# Patient Record
Sex: Female | Born: 1990 | Race: Black or African American | Hispanic: No | Marital: Single | Smoking: Current some day smoker
Health system: Southern US, Community
[De-identification: ages and names within clinical notes are randomized; demographics above are authoritative.]

## PROBLEM LIST (undated history)

## (undated) DIAGNOSIS — J45909 Unspecified asthma, uncomplicated: Secondary | ICD-10-CM

## (undated) DIAGNOSIS — R079 Chest pain, unspecified: Secondary | ICD-10-CM

## (undated) DIAGNOSIS — R011 Cardiac murmur, unspecified: Secondary | ICD-10-CM

## (undated) HISTORY — DX: Cardiac murmur, unspecified: R01.1

## (undated) HISTORY — DX: Chest pain, unspecified: R07.9

## (undated) HISTORY — DX: Unspecified asthma, uncomplicated: J45.909

---

## 2012-05-24 ENCOUNTER — Ambulatory Visit (INDEPENDENT_AMBULATORY_CARE_PROVIDER_SITE_OTHER): Admitting: Emergency Medicine

## 2012-05-24 VITALS — BP 106/64 | HR 61 | Temp 98.4°F | Resp 16 | Ht 64.0 in | Wt 104.0 lb

## 2012-05-24 DIAGNOSIS — Z0000 Encounter for general adult medical examination without abnormal findings: Secondary | ICD-10-CM

## 2012-05-24 NOTE — Progress Notes (Signed)
  Subjective:    Patient ID: Elaine Jones, female    DOB: November 02, 1990, 21 y.o.   MRN: [id number]  HPI  Sport physical  Review of Systems Sport physical    Objective:   Physical Exam  Sport physical      Assessment & Plan:  Fit to play

## 2012-05-29 ENCOUNTER — Ambulatory Visit (INDEPENDENT_AMBULATORY_CARE_PROVIDER_SITE_OTHER): Admitting: Family Medicine

## 2012-05-29 ENCOUNTER — Ambulatory Visit

## 2012-05-29 VITALS — BP 94/60 | HR 68 | Temp 97.9°F | Resp 12 | Ht 64.0 in | Wt 102.6 lb

## 2012-05-29 DIAGNOSIS — D649 Anemia, unspecified: Secondary | ICD-10-CM

## 2012-05-29 DIAGNOSIS — R079 Chest pain, unspecified: Secondary | ICD-10-CM

## 2012-05-29 DIAGNOSIS — R42 Dizziness and giddiness: Secondary | ICD-10-CM

## 2012-05-29 LAB — POCT CBC
Granulocyte percent: 50.4 %G (ref 37–80)
HCT, POC: 35.6 % — AB (ref 37.7–47.9)
Hemoglobin: 10.8 g/dL — AB (ref 12.2–16.2)
Lymph, poc: 3.3 (ref 0.6–3.4)
MCH, POC: 26.9 pg — AB (ref 27–31.2)
MCHC: 30.3 g/dL — AB (ref 31.8–35.4)
MCV: 88.5 fL (ref 80–97)
MID (cbc): 0.4 (ref 0–0.9)
MPV: 8.6 fL (ref 0–99.8)
POC Granulocyte: 3.8 (ref 2–6.9)
POC LYMPH PERCENT: 44.3 % (ref 10–50)
POC MID %: 5.3 %M (ref 0–12)
Platelet Count, POC: 238 10*3/uL (ref 142–424)
RBC: 4.02 M/uL — AB (ref 4.04–5.48)
RDW, POC: 13.5 %
WBC: 7.5 10*3/uL (ref 4.6–10.2)

## 2012-05-29 LAB — GLUCOSE, POCT (MANUAL RESULT ENTRY): POC Glucose: 90 mg/dl (ref 70–99)

## 2012-05-29 NOTE — Progress Notes (Signed)
[facility]:  Office Visit  Chief Complaint:  Chief Complaint  Patient presents with  . Chest Pain    2 weeks  tightness happens when she is playing basketball    HPI: Elaine Jones is a 21 y.o. female who complains of  " chest pressure, chest tightness can't describe it beyond that" intermittent x 2 weeks. No known injury. With exertion and with activities. Related to eating and activities.Burgess Estelle was first day playing BB in practice. Denies HA, vision changes, diaphoresis, cough, URI sxs, GERD; She has a history of CP like this before.  Has a PMH of heart murmur, saw cardiologist in [place] in the last 2 years and was dx as benign and was given return to play after getting echocardiogram, EKG, CXR were all normal.  Denies diabetes, XOL Sophomore in college   Past Medical History  Diagnosis Date  . Heart murmur   . Asthma    History reviewed. No pertinent past surgical history. History   Social History  . Marital Status: Single    Spouse Name: N/A    Number of Children: N/A  . Years of Education: N/A   Social History Main Topics  . Smoking status: Never Smoker   . Smokeless tobacco: None  . Alcohol Use: No  . Drug Use: No  . Sexually Active: None   Other Topics Concern  . None   Social History Narrative  . None   Family History  Problem Relation Age of Onset  . Hypertension Mother    Allergies  Allergen Reactions  . Amoxicillin    Prior to Admission medications   Medication Sig Start Date End Date Taking? Authorizing Provider  albuterol (PROVENTIL HFA;VENTOLIN HFA) 108 (90 BASE) MCG/ACT inhaler Inhale 2 puffs into the lungs every 6 (six) hours as needed.   Yes Historical Provider, MD     ROS: The patient denies fevers, chills, night sweats, unintentional weight loss, chest pain, palpitations, wheezing, dyspnea on exertion, nausea, vomiting, abdominal pain, dysuria, hematuria, melena, numbness, weakness, or  tingling.  All other systems have been reviewed and were otherwise negative with the exception of those mentioned in the HPI and as above.    PHYSICAL EXAM: Filed Vitals:   05/29/12 1816  BP: 94/60  Pulse: 68  Temp: 97.9 F (36.6 C)  Resp: 12   Filed Vitals:   05/29/12 1816  Height: 5\' 4"  (1.626 m)  Weight: 102 lb 9.6 oz (46.539 kg)   Body mass index is 17.61 kg/(m^2).  General: Alert, no acute distress HEENT:  Normocephalic, atraumatic, oropharynx patent.  Cardiovascular:  Regular rate and rhythm, no rubs , + LLSB systolic murmur, or gallops.  No Carotid bruits, radial pulse intact. No pedal edema.  Respiratory: Clear to auscultation bilaterally.  No wheezes, rales, or rhonchi.  No cyanosis, no use of accessory musculature GI: No organomegaly, abdomen is soft and non-tender, positive bowel sounds.  No masses. Skin: No rashes. Neurologic: Facial musculature symmetric. Psychiatric: Patient is appropriate throughout our interaction. Lymphatic: No cervical lymphadenopathy Musculoskeletal: Gait intact.   LABS: Results for orders placed in visit on 05/29/12  POCT CBC      Component Value Range   WBC 7.5  4.6 - 10.2 K/uL   Lymph, poc 3.3  0.6 - 3.4   POC LYMPH PERCENT 44.3  10 - 50 %L   MID (cbc) 0.4  0 - 0.9   POC MID % 5.3  0 - 12 %M  POC Granulocyte 3.8  2 - 6.9   Granulocyte percent 50.4  37 - 80 %G   RBC 4.02 (*) 4.04 - 5.48 M/uL   Hemoglobin 10.8 (*) 12.2 - 16.2 g/dL   HCT, POC 16.1 (*) 09.6 - 47.9 %   MCV 88.5  80 - 97 fL   MCH, POC 26.9 (*) 27 - 31.2 pg   MCHC 30.3 (*) 31.8 - 35.4 g/dL   RDW, POC 04.5     Platelet Count, POC 238  142 - 424 K/uL   MPV 8.6  0 - 99.8 fL  GLUCOSE, POCT (MANUAL RESULT ENTRY)      Component Value Range   POC Glucose 90  70 - 99 mg/dl     EKG/XRAY:   Primary read interpreted by Dr. Conley Rolls at [facility]. Neg for cardiopulm process   ASSESSMENT/PLAN: Encounter Diagnoses  Name Primary?  . Chest pain Yes  . Dizziness   . Anemia     Chest pain-Msk related CP vs cardiac vs less likely pulmonary in origin. Patient has been working out more with BB practice. However she does have a h/o of heart murmur and has seen a cardiologist in past for this, echo and ekg per patient was normal inlast 2 years. Will refer to Dr. Jacinto Halim to establish care since patient is from [place].  Anemia-Iron studies pending. ? Due to heavy menses. Possible contributor to dizziness and CP.  Go to ER prn for worsenign sxs   LE, THAO PHUONG, DO 05/29/2012 7:30 PM

## 2012-05-30 ENCOUNTER — Telehealth: Payer: Self-pay

## 2012-05-30 LAB — COMPREHENSIVE METABOLIC PANEL
ALT: 25 U/L (ref 0–35)
Albumin: 4.8 g/dL (ref 3.5–5.2)
CO2: 24 mEq/L (ref 19–32)
Chloride: 105 mEq/L (ref 96–112)
Glucose, Bld: 67 mg/dL — ABNORMAL LOW (ref 70–99)
Potassium: 3.5 mEq/L (ref 3.5–5.3)
Sodium: 140 mEq/L (ref 135–145)
Total Bilirubin: 0.8 mg/dL (ref 0.3–1.2)
Total Protein: 7.8 g/dL (ref 6.0–8.3)

## 2012-05-30 LAB — IRON AND TIBC
%SAT: 24 % (ref 20–55)
Iron: 79 ug/dL (ref 42–145)
TIBC: 323 ug/dL (ref 250–470)
UIBC: 244 ug/dL (ref 125–400)

## 2012-05-30 LAB — COMPREHENSIVE METABOLIC PANEL WITH GFR
AST: 74 U/L — ABNORMAL HIGH (ref 0–37)
Alkaline Phosphatase: 42 U/L (ref 39–117)
BUN: 20 mg/dL (ref 6–23)
Calcium: 9.9 mg/dL (ref 8.4–10.5)
Creat: 0.9 mg/dL (ref 0.50–1.10)

## 2012-05-30 LAB — FERRITIN: Ferritin: 55 ng/mL (ref 10–291)

## 2012-05-30 LAB — TSH: TSH: 2.313 u[IU]/mL (ref 0.350–4.500)

## 2012-05-30 NOTE — Telephone Encounter (Signed)
I have advised patient the note is at front desk for her not to participate in sports until she has seen cardiology.

## 2012-05-30 NOTE — Telephone Encounter (Signed)
PT WOULD LIKE DR. LE TO CALL HER REGARDING NOT PRACTICING BASKETBALL UNTIL AFTER HER APPOINTMENT WITH CARDIOLOGIST.   CBN [phone]

## 2012-05-31 ENCOUNTER — Encounter: Payer: Self-pay | Admitting: Radiology

## 2012-06-12 ENCOUNTER — Ambulatory Visit: Payer: Self-pay | Admitting: Cardiovascular Disease

## 2012-06-12 ENCOUNTER — Encounter: Payer: Self-pay | Admitting: Family Medicine

## 2012-06-18 ENCOUNTER — Encounter: Payer: Self-pay | Admitting: *Deleted

## 2012-06-18 ENCOUNTER — Encounter: Payer: Self-pay | Admitting: Cardiology

## 2012-06-18 DIAGNOSIS — R011 Cardiac murmur, unspecified: Secondary | ICD-10-CM | POA: Insufficient documentation

## 2012-06-18 DIAGNOSIS — J45909 Unspecified asthma, uncomplicated: Secondary | ICD-10-CM | POA: Insufficient documentation

## 2012-06-18 DIAGNOSIS — R079 Chest pain, unspecified: Secondary | ICD-10-CM | POA: Insufficient documentation

## 2012-06-19 ENCOUNTER — Ambulatory Visit: Payer: Self-pay | Admitting: Cardiovascular Disease

## 2012-09-03 ENCOUNTER — Telehealth: Payer: Self-pay

## 2012-09-03 NOTE — Telephone Encounter (Signed)
PT SAID SHE WAS SEEN A FEW MONTHS AGO AND WAS REFERRED TO CARDIOLOGIST.  SHE SAID SHE WENT AND DID EVERYTHING SHE WAS TOLD.  SHE NOW NEEDS A NOTE SAYING SHE IS CLEARED TO PLAY SPORTS AGAIN.  I ADVISED HER THIS MAY REQUIRE A FOLLOW UP VISIT.  [phone]

## 2012-09-03 NOTE — Telephone Encounter (Signed)
Can you clear? Or does she need to return?

## 2012-09-04 ENCOUNTER — Telehealth: Payer: Self-pay | Admitting: Radiology

## 2012-09-04 ENCOUNTER — Encounter: Payer: Self-pay | Admitting: Radiology

## 2012-09-04 ENCOUNTER — Encounter: Payer: Self-pay | Admitting: Family Medicine

## 2012-09-04 DIAGNOSIS — R011 Cardiac murmur, unspecified: Secondary | ICD-10-CM

## 2012-09-04 NOTE — Telephone Encounter (Signed)
Letter c

## 2012-09-04 NOTE — Telephone Encounter (Addendum)
She has seen Dr Jacinto Halim. I called his office. They are faxing the report. Report/ received. Have given to Dr Conley Rolls

## 2012-09-04 NOTE — Assessment & Plan Note (Signed)
Patient has seen Dr Jacinto Halim on 06/15/12 for evaluation had stress testing/ treadmill this was negative for exercise induced ischemia/ normal study

## 2012-09-04 NOTE — Telephone Encounter (Signed)
Letter clearing her is placed at front desk. Patient advised. Dr Conley Rolls also wanted me to advise patient to take daily iron suppliment for 2 months then return to clinic for repeat labs./ office visit. Patient advised.

## 2012-09-04 NOTE — Telephone Encounter (Signed)
Message copied by Caffie Damme on Tue Sep 04, 2012 10:17 AM ------      Message from: LE, [place] P      Created: Mon Sep 03, 2012 10:21 AM      Regarding: Return to play       Emmette Katt-            Can you get the report from the cardiologist she saw? Was is Dr. Myrtis Ser. If I see his note and he does not think it was anything then I can write her a note to return to play but I need to see the note and/or any workup that they did.            Thanks,      T. Conley Rolls

## 2012-09-10 ENCOUNTER — Telehealth: Payer: Self-pay | Admitting: Radiology

## 2012-09-10 NOTE — Telephone Encounter (Signed)
Patient wanted date changed on the note for sports/ this is done and given to patient.

## 2012-11-15 ENCOUNTER — Ambulatory Visit (INDEPENDENT_AMBULATORY_CARE_PROVIDER_SITE_OTHER): Admitting: Physician Assistant

## 2012-11-15 VITALS — BP 136/99 | HR 105 | Temp 98.0°F | Resp 16 | Ht 63.0 in | Wt 107.0 lb

## 2012-11-15 DIAGNOSIS — M79645 Pain in left finger(s): Secondary | ICD-10-CM

## 2012-11-15 DIAGNOSIS — S61209A Unspecified open wound of unspecified finger without damage to nail, initial encounter: Secondary | ICD-10-CM

## 2012-11-15 DIAGNOSIS — S61218A Laceration without foreign body of other finger without damage to nail, initial encounter: Secondary | ICD-10-CM

## 2012-11-15 DIAGNOSIS — M79609 Pain in unspecified limb: Secondary | ICD-10-CM

## 2012-11-15 DIAGNOSIS — M79644 Pain in right finger(s): Secondary | ICD-10-CM

## 2012-11-15 NOTE — Progress Notes (Signed)
   [address] [place] [address]   Phone [phone]  Subjective:    Patient ID: Elaine Jones, female    DOB: 03/27/1991, 22 y.o.   MRN: [id number]  HPI  Pt presents to clinic with lacerations to both her 2nd digits.  She was taking a calender off the wall and when she removed it it has nails that sliced her fingers.  She believes she is updated with her tetanus because she had to get shots before she started to play basketball.  She has pain at both the wound sites.  Review of Systems  Skin: Positive for wound.       Objective:   Physical Exam  Vitals reviewed. Constitutional: She is oriented to person, place, and time. She appears well-developed and well-nourished.  Pulmonary/Chest: Effort normal.  Neurological: She is alert and oriented to person, place, and time.  Skin: Skin is warm and dry.       1.5 cm laceration on 2nd digit of L hand on lateral edge of distal aspect of finger in a V shaped. 2 cm laceration on 2nd digit of R hand on the lateral edge of distal aspect, no pad of finger a skin avulsion ~1cm long 0.5 cm very superficial laceration on 3rd distal aspect of 3rd R digit  Psychiatric: She has a normal mood and affect. Her behavior is normal. Judgment and thought content normal.   Procedure:  MC block bilaterally with 2% lidocaine.  L 2nd digit closed with 5-0 Ethilin #3 SI sutures.  Wound difficult to close to to the size of the thin skin flap.  R 2nd digit closed with 5-0 Ethilon #2 SI and #2 horizontal sutures.  Skin avulsion covered with xeroform gauze.  Drsg placed on both wounds.     Assessment & Plan:

## 2012-11-15 NOTE — Patient Instructions (Addendum)

## 2012-11-16 ENCOUNTER — Encounter: Payer: Self-pay | Admitting: Physician Assistant

## 2012-11-26 ENCOUNTER — Ambulatory Visit (INDEPENDENT_AMBULATORY_CARE_PROVIDER_SITE_OTHER): Admitting: Physician Assistant

## 2012-11-26 VITALS — BP 120/64 | HR 66 | Temp 98.5°F | Resp 16 | Ht 64.0 in | Wt 110.0 lb

## 2012-11-26 DIAGNOSIS — S61219A Laceration without foreign body of unspecified finger without damage to nail, initial encounter: Secondary | ICD-10-CM

## 2012-11-26 DIAGNOSIS — S61209A Unspecified open wound of unspecified finger without damage to nail, initial encounter: Secondary | ICD-10-CM

## 2012-11-26 NOTE — Progress Notes (Signed)
   [address] [place] [address]   Phone [phone]  Subjective:    Patient ID: Elaine Jones, female    DOB: 1990-10-28, 22 y.o.   MRN: [id number]  HPI  Pt presents to clinic for suture removal. She is not having any problems with the fingers other than they are a little sore and the sensation is a little different.  Review of Systems     Objective:   Physical Exam  Vitals reviewed. Constitutional: She is oriented to person, place, and time. She appears well-developed and well-nourished.  HENT:  Head: Normocephalic and atraumatic.  Right Ear: External ear normal.  Left Ear: External ear normal.  Pulmonary/Chest: Effort normal.  Neurological: She is alert and oriented to person, place, and time.  Skin: Skin is warm and dry.  Well healed wounds.  Sutures removed with difficulty.  Psychiatric: She has a normal mood and affect. Her behavior is normal. Judgment and thought content normal.       Assessment & Plan:  Laceration on fingers - healed and wound care d/w pt.

## 2013-08-24 ENCOUNTER — Ambulatory Visit: Payer: BC Managed Care – PPO | Admitting: Family Medicine

## 2013-08-24 VITALS — BP 104/78 | HR 72 | Temp 98.1°F | Resp 16 | Ht 64.0 in | Wt 100.0 lb

## 2013-08-24 DIAGNOSIS — J029 Acute pharyngitis, unspecified: Secondary | ICD-10-CM

## 2013-08-24 DIAGNOSIS — B9789 Other viral agents as the cause of diseases classified elsewhere: Secondary | ICD-10-CM

## 2013-08-24 DIAGNOSIS — R059 Cough, unspecified: Secondary | ICD-10-CM

## 2013-08-24 DIAGNOSIS — B349 Viral infection, unspecified: Secondary | ICD-10-CM

## 2013-08-24 DIAGNOSIS — R05 Cough: Secondary | ICD-10-CM

## 2013-08-24 MED ORDER — BENZONATATE 100 MG PO CAPS: 100.0000 mg | ORAL_CAPSULE | Freq: Three times a day (TID) | ORAL | Status: DC | PRN

## 2013-08-24 MED ORDER — HYDROCODONE-HOMATROPINE 5-1.5 MG/5ML PO SYRP: 5.0000 mL | ORAL_SOLUTION | ORAL | Status: DC | PRN

## 2013-08-24 NOTE — Patient Instructions (Signed)
Drink lots of fluids  Get lots of rest  Use cough syrup at night or weekends  Take cough pills if going to be needing to be awake like on school days

## 2013-08-24 NOTE — Progress Notes (Signed)
Subjective: 22 year old Elaine Jones. [employer] student who is here with a sore throat and cough. She started getting sick with it either Thursday. She had a bad sore throat Thursday and could not go to class. She's developed a cough for can bring things up. No wheezing. She does not smoke. Ears are not bothering her. Nasal congestion but not blowing out a whole lot. Otherwise healthy.  Objective: Green hair.TMs normal. Throat erythematous without exudate. Neck supple without significant nodes. Chest is clear. Heart regular. Has a tight cough.  Assessment: Sore throat Cough Viral URI  Plan: Strep test  Results for orders placed in visit on 08/24/13  POCT RAPID STREP A (OFFICE)      Result Value Range   Rapid Strep A Screen Negative  Negative   Viral syndrome, cough, sore throat.  Symptomatic treatment

## 2013-08-27 ENCOUNTER — Encounter: Payer: Self-pay | Admitting: *Deleted

## 2014-10-28 ENCOUNTER — Encounter (HOSPITAL_COMMUNITY): Payer: Self-pay | Admitting: Emergency Medicine

## 2014-10-28 ENCOUNTER — Emergency Department (HOSPITAL_COMMUNITY)
Admission: EM | Admit: 2014-10-28 | Discharge: 2014-10-29 | Disposition: A | Discharge status: Home / Self Care | Payer: Medicaid - Out of State | Attending: Emergency Medicine | Admitting: Emergency Medicine

## 2014-10-28 DIAGNOSIS — L509 Urticaria, unspecified: Secondary | ICD-10-CM | POA: Diagnosis not present

## 2014-10-28 DIAGNOSIS — R011 Cardiac murmur, unspecified: Secondary | ICD-10-CM | POA: Insufficient documentation

## 2014-10-28 DIAGNOSIS — R51 Headache: Secondary | ICD-10-CM | POA: Diagnosis present

## 2014-10-28 DIAGNOSIS — J45909 Unspecified asthma, uncomplicated: Secondary | ICD-10-CM | POA: Insufficient documentation

## 2014-10-28 DIAGNOSIS — Z880 Allergy status to penicillin: Secondary | ICD-10-CM | POA: Diagnosis not present

## 2014-10-28 DIAGNOSIS — Z79899 Other long term (current) drug therapy: Secondary | ICD-10-CM | POA: Insufficient documentation

## 2014-10-28 NOTE — ED Notes (Signed)
Pt added that she has had diarrhea since Thursday and has nausea she would rate a 3 of 10 on a scale of 1 to 10

## 2014-10-28 NOTE — ED Notes (Signed)
Pt states she is breaking out in hives all over  Pt states it started about 2 hrs ago  Pt states she took 2 allergy pills about 30 to 45 minutes ago  Pt denies changing anything as far as laundry detergent, soap, etc

## 2014-10-29 MED ORDER — DIPHENHYDRAMINE HCL 25 MG PO TABS: 25.0000 mg | ORAL_TABLET | Freq: Four times a day (QID) | ORAL | Status: DC | PRN

## 2014-10-29 MED ORDER — DIPHENHYDRAMINE HCL 25 MG PO CAPS
25.0000 mg | ORAL_CAPSULE | Freq: Once | ORAL | Status: AC
  Administered 2014-10-29: 25 mg via ORAL
  Filled 2014-10-29: qty 1

## 2014-10-29 MED ORDER — FAMOTIDINE 20 MG PO TABS: 20.0000 mg | ORAL_TABLET | Freq: Two times a day (BID) | ORAL | Status: DC

## 2014-10-29 MED ORDER — PREDNISONE 20 MG PO TABS
60.0000 mg | ORAL_TABLET | Freq: Once | ORAL | Status: AC
  Administered 2014-10-29: 60 mg via ORAL
  Filled 2014-10-29: qty 3

## 2014-10-29 MED ORDER — FAMOTIDINE 20 MG PO TABS
40.0000 mg | ORAL_TABLET | Freq: Once | ORAL | Status: AC
  Administered 2014-10-29: 40 mg via ORAL
  Filled 2014-10-29: qty 2

## 2014-10-29 NOTE — ED Provider Notes (Signed)
CSN: [id number]     Arrival date & time 10/28/14  2336 History   First MD Initiated Contact with Patient 10/28/14 2343     Chief Complaint  Patient presents with  . Rash    (Consider location/radiation/quality/duration/timing/severity/associated sxs/prior Treatment) Patient is a 24 y.o. female presenting with rash. The history is provided by the patient. No language interpreter was used.  Rash Location:  Full body Quality: itchiness, redness and swelling   Quality: not painful, not peeling, not scaling and not weeping   Severity:  Moderate Onset quality:  Sudden Duration:  3 hours Timing:  Constant Progression:  Improving Chronicity:  New Context: not eggs, not exposure to similar rash, not food, not medications, not new detergent/soap, not nuts, not plant contact and not sick contacts   Relieved by:  Antihistamines Exacerbated by: itching. Associated symptoms: no diarrhea (no diarrhea associated with onset of rash), no fatigue, no fever, no hoarse voice, no shortness of breath, no throat swelling, no tongue swelling and not wheezing     Past Medical History  Diagnosis Date  . Heart murmur   . Asthma   . Chest pain    History reviewed. No pertinent past surgical history. Family History  Problem Relation Age of Onset  . Hypertension Mother    History  Substance Use Topics  . Smoking status: Never Smoker   . Smokeless tobacco: Not on file  . Alcohol Use: No   OB History    No data available      Review of Systems  Constitutional: Negative for fever and fatigue.  HENT: Negative for hoarse voice.   Respiratory: Negative for shortness of breath and wheezing.   Gastrointestinal: Negative for diarrhea (no diarrhea associated with onset of rash).  Skin: Positive for rash.  All other systems reviewed and are negative.   Allergies  Amoxicillin  Home Medications   Prior to Admission medications   Medication Sig Start Date End Date Taking? Authorizing Provider   ibuprofen (ADVIL,MOTRIN) 200 MG tablet Take 400 mg by mouth every 6 (six) hours as needed for moderate pain.   Yes Historical Provider, MD  albuterol (PROVENTIL HFA;VENTOLIN HFA) 108 (90 BASE) MCG/ACT inhaler Inhale 2 puffs into the lungs every 6 (six) hours as needed.    Historical Provider, MD  benzonatate (TESSALON) 100 MG capsule Take 1-2 capsules (100-200 mg total) by mouth 3 (three) times daily as needed for cough. Patient not taking: Reported on 10/29/2014 08/24/13   Peyton Najjaravid H Hopper, MD  diphenhydrAMINE (BENADRYL) 25 MG tablet Take 1 tablet (25 mg total) by mouth every 6 (six) hours as needed for itching (Rash). 10/29/14   Antony MaduraKelly Ahlijah Raia, PA-C  famotidine (PEPCID) 20 MG tablet Take 1 tablet (20 mg total) by mouth 2 (two) times daily. 10/29/14   Antony MaduraKelly Theotis Gerdeman, PA-C  HYDROcodone-homatropine (HYCODAN) 5-1.5 MG/5ML syrup Take 5 mLs by mouth every 4 (four) hours as needed for cough. Patient not taking: Reported on 10/29/2014 08/24/13   Peyton Najjaravid H Hopper, MD   BP 139/92 mmHg  Pulse 93  Temp(Src) 97.5 F (36.4 C) (Oral)  Resp 16  SpO2 100%  LMP 10/28/2014 (Exact Date)   Physical Exam  Constitutional: She is oriented to person, place, and time. She appears well-developed and well-nourished. No distress.  HENT:  Head: Normocephalic and atraumatic.  Mouth/Throat: Oropharynx is clear and moist. No oropharyngeal exudate.  Oropharynx clear. No angioedema. Patient tolerating secretions without difficulty.  Eyes: Conjunctivae and EOM are normal. No scleral icterus.  Neck: Normal  range of motion.  No stridor  Pulmonary/Chest: Effort normal and breath sounds normal. No respiratory distress. She has no wheezes.  Respirations even and unlabored. Lungs clear.  Musculoskeletal: Normal range of motion.  Neurological: She is alert and oriented to person, place, and time. She exhibits normal muscle tone. Coordination normal.  Skin: Skin is warm and dry. Rash noted. She is not diaphoretic. No erythema. No pallor.   Pruritic, raised, macular, erythematous rash consistent with urticaria noted to chest, back, and bilateral extremities. Patient states that this is improved compared to prior after treatment with generic antihistamines.  Psychiatric: She has a normal mood and affect. Her behavior is normal.  Nursing note and vitals reviewed.   ED Course  Procedures (including critical care time) Labs Review Labs Reviewed - No data to display  Imaging Review No results found.   EKG Interpretation None      Medications  diphenhydrAMINE (BENADRYL) capsule 25 mg (25 mg Oral Given 10/29/14 0039)  famotidine (PEPCID) tablet 40 mg (40 mg Oral Given 10/29/14 0039)  predniSONE (DELTASONE) tablet 60 mg (60 mg Oral Given 10/29/14 0040)    MDM   Final diagnoses:  Urticaria    Patient re-evaluated prior to discharge, is hemodynamically stable, in no respiratory distress, and denies the feeling of throat closing. Pt has been advised to take OTC benadryl and return to the ED if they have a mod-severe allergic rxn (s/s including throat closing, difficulty breathing, swelling of lips face or tongue). Pt is to follow up with their PCP. Pt is agreeable with plan and verbalizes understanding with plan. Patient discharged in good condition.   Filed Vitals:   10/28/14 2342  BP: 139/92  Pulse: 93  Temp: 97.5 F (36.4 C)  TempSrc: Oral  Resp: 16  SpO2: 100%     Antony Madura, PA-C 10/29/14 0245  April K Palumbo-Rasch, MD 10/29/14 [phone]

## 2014-10-29 NOTE — Discharge Instructions (Signed)
Hives Hives are itchy, red, swollen areas of the skin. They can vary in size and location on your body. Hives can come and go for hours or several days (acute hives) or for several weeks (chronic hives). Hives do not spread from person to person (noncontagious). They may get worse with scratching, exercise, and emotional stress. CAUSES   Allergic reaction to food, additives, or drugs.  Infections, including the common cold.  Illness, such as vasculitis, lupus, or thyroid disease.  Exposure to sunlight, heat, or cold.  Exercise.  Stress.  Contact with chemicals. SYMPTOMS   Red or white swollen patches on the skin. The patches may change size, shape, and location quickly and repeatedly.  Itching.  Swelling of the hands, feet, and face. This may occur if hives develop deeper in the skin. DIAGNOSIS  Your caregiver can usually tell what is wrong by performing a physical exam. Skin or blood tests may also be done to determine the cause of your hives. In some cases, the cause cannot be determined. TREATMENT  Mild cases usually get better with medicines such as antihistamines. Severe cases may require an emergency epinephrine injection. If the cause of your hives is known, treatment includes avoiding that trigger.  HOME CARE INSTRUCTIONS   Avoid causes that trigger your hives.  Take antihistamines as directed by your caregiver to reduce the severity of your hives. Non-sedating or low-sedating antihistamines are usually recommended. Do not drive while taking an antihistamine.  Take any other medicines prescribed for itching as directed by your caregiver.  Wear loose-fitting clothing.  Keep all follow-up appointments as directed by your caregiver. SEEK MEDICAL CARE IF:   You have persistent or severe itching that is not relieved with medicine.  You have painful or swollen joints. SEEK IMMEDIATE MEDICAL CARE IF:   You have a fever.  Your tongue or lips are swollen.  You have  trouble breathing or swallowing.  You feel tightness in the throat or chest.  You have abdominal pain. These problems may be the first sign of a life-threatening allergic reaction. Call your local emergency services (911 in [place] MAKE SURE YOU:   Understand these instructions.  Will watch your condition.  Will get help right away if you are not doing well or get worse. Document Released: 09/26/2005 Document Revised: 10/01/2013 Document Reviewed: 12/20/2011 Exit[employer] Patient Information 2015 [employer]. This information is not intended to replace advice given to you by your health care provider. Make sure you discuss any questions you have with your health care provider.  

## 2016-01-18 ENCOUNTER — Emergency Department (HOSPITAL_COMMUNITY)
Admission: EM | Admit: 2016-01-18 | Discharge: 2016-01-18 | Disposition: A | Discharge status: Home / Self Care | Payer: Medicaid - Out of State | Attending: Emergency Medicine | Admitting: Emergency Medicine

## 2016-01-18 ENCOUNTER — Encounter (HOSPITAL_COMMUNITY): Payer: Self-pay | Admitting: Oncology

## 2016-01-18 DIAGNOSIS — J069 Acute upper respiratory infection, unspecified: Secondary | ICD-10-CM

## 2016-01-18 DIAGNOSIS — J45901 Unspecified asthma with (acute) exacerbation: Secondary | ICD-10-CM | POA: Insufficient documentation

## 2016-01-18 DIAGNOSIS — R109 Unspecified abdominal pain: Secondary | ICD-10-CM | POA: Insufficient documentation

## 2016-01-18 DIAGNOSIS — Z880 Allergy status to penicillin: Secondary | ICD-10-CM | POA: Insufficient documentation

## 2016-01-18 DIAGNOSIS — R011 Cardiac murmur, unspecified: Secondary | ICD-10-CM | POA: Insufficient documentation

## 2016-01-18 DIAGNOSIS — Z7951 Long term (current) use of inhaled steroids: Secondary | ICD-10-CM | POA: Insufficient documentation

## 2016-01-18 DIAGNOSIS — Z79899 Other long term (current) drug therapy: Secondary | ICD-10-CM | POA: Insufficient documentation

## 2016-01-18 MED ORDER — FLUTICASONE PROPIONATE 50 MCG/ACT NA SUSP: 2.0000 | Freq: Every day | NASAL | Status: DC

## 2016-01-18 MED ORDER — ALBUTEROL SULFATE HFA 108 (90 BASE) MCG/ACT IN AERS: 2.0000 | INHALATION_SPRAY | RESPIRATORY_TRACT | Status: AC | PRN

## 2016-01-18 MED ORDER — ALBUTEROL SULFATE HFA 108 (90 BASE) MCG/ACT IN AERS
2.0000 | INHALATION_SPRAY | RESPIRATORY_TRACT | Status: DC | PRN
  Administered 2016-01-18: 2 via RESPIRATORY_TRACT
  Filled 2016-01-18: qty 6.7

## 2016-01-18 MED ORDER — CETIRIZINE HCL 10 MG PO CHEW: 10.0000 mg | CHEWABLE_TABLET | Freq: Every day | ORAL | Status: DC

## 2016-01-18 NOTE — ED Notes (Signed)
Pt reports cough, sore throat, congestion since Thursday.  Pt states she mainly presents to the ED for a work note.

## 2016-01-18 NOTE — ED Provider Notes (Signed)
CSN: [id number]     Arrival date & time 01/18/16  0532 History   First MD Initiated Contact with Patient 01/18/16 0545     Chief Complaint  Patient presents with  . Flu like sx      (Consider location/radiation/quality/duration/timing/severity/associated sxs/prior Treatment) HPI  This is a 25 year old female with a history of asthma who presents with multiple complaints. Patient reports nonproductive cough, sore throat, and congestion since Thursday. She denies any fevers but does endorse chills. 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 Denies any abdominal pain, vomiting, diarrhea. Reports sneezing and watery eyes as well. Patient reports she is taking over-the-counter Mucinex and TheraFlu with minimal relief.  Past Medical History  Diagnosis Date  . Heart murmur   . Asthma   . Chest pain    History reviewed. No pertinent past surgical history. Family History  Problem Relation Age of Onset  . Hypertension Mother    Social History  Substance Use Topics  . Smoking status: Never Smoker   . Smokeless tobacco: None  . Alcohol Use: No   OB History    No data available     Review of Systems  Constitutional: Positive for chills. Negative for fever.  HENT: Positive for sore throat. Negative for trouble swallowing.   Respiratory: Positive for cough and wheezing. Negative for shortness of breath.   Cardiovascular: Negative for chest pain.  Gastrointestinal: Positive for abdominal pain. Negative for vomiting and diarrhea.  Musculoskeletal: Negative for back pain.  Skin: Negative for rash.  All other systems reviewed and are negative.     Allergies  Amoxicillin  Home Medications   Prior to Admission medications   Medication Sig Start Date End Date Taking? Authorizing Provider  albuterol (PROVENTIL HFA;VENTOLIN HFA) 108 (90 Base) MCG/ACT inhaler  Inhale 2 puffs into the lungs every 4 (four) hours as needed for wheezing or shortness of breath. 01/18/16   Shon Baton, MD  benzonatate (TESSALON) 100 MG capsule Take 1-2 capsules (100-200 mg total) by mouth 3 (three) times daily as needed for cough. Patient not taking: Reported on 10/29/2014 08/24/13   Peyton Najjar, MD  cetirizine (ZYRTEC) 10 MG chewable tablet Chew 1 tablet (10 mg total) by mouth daily. 01/18/16   Shon Baton, MD  diphenhydrAMINE (BENADRYL) 25 MG tablet Take 1 tablet (25 mg total) by mouth every 6 (six) hours as needed for itching (Rash). 10/29/14   Antony Madura, PA-C  famotidine (PEPCID) 20 MG tablet Take 1 tablet (20 mg total) by mouth 2 (two) times daily. 10/29/14   Antony Madura, PA-C  fluticasone (FLONASE) 50 MCG/ACT nasal spray Place 2 sprays into both nostrils daily. 01/18/16   Shon Baton, MD  HYDROcodone-homatropine (HYCODAN) 5-1.5 MG/5ML syrup Take 5 mLs by mouth every 4 (four) hours as needed for cough. Patient not taking: Reported on 10/29/2014 08/24/13   Peyton Najjar, MD  ibuprofen (ADVIL,MOTRIN) 200 MG tablet Take 400 mg by mouth every 6 (six) hours as needed for moderate pain.    Historical Provider, MD   BP 116/82 mmHg  Pulse 60  Temp(Src) 98.3 F (36.8 C) (Oral)  Resp 12  Ht  (1.626 m)  Wt 108 lb (48.988 kg)  BMI 18.53 kg/m2  SpO2 100%  LMP 01/13/2016 (Exact Date) Physical Exam  Constitutional: She is oriented to person, place, and time. She appears  well-developed and well-nourished. No distress.  HENT:  Head: Normocephalic and atraumatic.  Mouth/Throat: Oropharynx is clear and moist. No oropharyngeal exudate.  Uvula midline, postnasal drip noted  Neck: Normal range of motion. Neck supple.  Cardiovascular: Normal rate, regular rhythm and normal heart sounds.   Pulmonary/Chest: Effort normal and breath sounds normal. No respiratory distress. She has no wheezes.  Abdominal: Soft. There is no tenderness. There is no rebound.   Neurological: She is alert and oriented to person, place, and time.  Skin: Skin is warm and dry.  Psychiatric: She has a normal mood and affect.  Nursing note and vitals reviewed.   ED Course  Procedures (including critical care time) Labs Review Labs Reviewed - No data to display  Imaging Review No results found. I have personally reviewed and evaluated these images and lab results as part of my medical decision-making.   EKG Interpretation None      MDM   Final diagnoses:  Upper respiratory infection    Patient presents with upper respiratory symptoms. Likely viral versus seasonal allergies. No evidence of tonsillar exudate, cough, cervical adenopathy. Doubt strep. History of asthma. Currently not wheezing. However, given cough and upper respiratory complaints, will provide with an inhaler. Also discussed with patient supportive measures including Flonase and a daily allergy medication. Stated understanding.  After history, exam, and medical workup I feel the patient has been appropriately medically screened and is safe for discharge home. Pertinent diagnoses were discussed with the patient. Patient was given return precautions.     Shon Batonourtney F Morris [place], MD 01/18/16 [phone]

## 2016-01-18 NOTE — Discharge Instructions (Signed)
Upper Respiratory Infection, Adult Most upper respiratory infections (URIs) are a viral infection of the air passages leading to the lungs. A URI affects the nose, throat, and upper air passages. The most common type of URI is nasopharyngitis and is typically referred to as "the common cold." URIs run their course and usually go away on their own. Most of the time, a URI does not require medical attention, but sometimes a bacterial infection in the upper airways can follow a viral infection. This is called a secondary infection. Sinus and middle ear infections are common types of secondary upper respiratory infections. Bacterial pneumonia can also complicate a URI. A URI can worsen asthma and chronic obstructive pulmonary disease (COPD). Sometimes, these complications can require emergency medical care and may be life threatening.  CAUSES Almost all URIs are caused by viruses. A virus is a type of germ and can spread from one person to another.  RISKS FACTORS You may be at risk for a URI if:   You smoke.   You have chronic heart or lung disease.  You have a weakened defense (immune) system.   You are very young or very old.   You have nasal allergies or asthma.  You work in crowded or poorly ventilated areas.  You work in health care facilities or schools. SIGNS AND SYMPTOMS  Symptoms typically develop 2-3 days after you come in contact with a cold virus. Most viral URIs last 7-10 days. However, viral URIs from the influenza virus (flu virus) can last 14-18 days and are typically more severe. Symptoms may include:   Runny or stuffy (congested) nose.   Sneezing.   Cough.   Sore throat.   Headache.   Fatigue.   Fever.   Loss of appetite.   Pain in your forehead, behind your eyes, and over your cheekbones (sinus pain).  Muscle aches.  DIAGNOSIS  Your health care provider may diagnose a URI by:  Physical exam.  Tests to check that your symptoms are not due to  another condition such as:  Strep throat.  Sinusitis.  Pneumonia.  Asthma. TREATMENT  A URI goes away on its own with time. It cannot be cured with medicines, but medicines may be prescribed or recommended to relieve symptoms. Medicines may help:  Reduce your fever.  Reduce your cough.  Relieve nasal congestion. HOME CARE INSTRUCTIONS   Take medicines only as directed by your health care provider.   Gargle warm saltwater or take cough drops to comfort your throat as directed by your health care provider.  Use a warm mist humidifier or inhale steam from a shower to increase air moisture. This may make it easier to breathe.  Drink enough fluid to keep your urine clear or pale yellow.   Eat soups and other clear broths and maintain good nutrition.   Rest as needed.   Return to work when your temperature has returned to normal or as your health care provider advises. You may need to stay home longer to avoid infecting others. You can also use a face mask and careful hand washing to prevent spread of the virus.  Increase the usage of your inhaler if you have asthma.   Do not use any tobacco products, including cigarettes, chewing tobacco, or electronic cigarettes. If you need help quitting, ask your health care provider. PREVENTION  The best way to protect yourself from getting a cold is to practice good hygiene.   Avoid oral or hand contact with people with cold   symptoms.   Wash your hands often if contact occurs.  There is no clear evidence that vitamin C, vitamin E, echinacea, or exercise reduces the chance of developing a cold. However, it is always recommended to get plenty of rest, exercise, and practice good nutrition.  SEEK MEDICAL CARE IF:   You are getting worse rather than better.   Your symptoms are not controlled by medicine.   You have chills.  You have worsening shortness of breath.  You have brown or red mucus.  You have yellow or brown nasal  discharge.  You have pain in your face, especially when you bend forward.  You have a fever.  You have swollen neck glands.  You have pain while swallowing.  You have white areas in the back of your throat. SEEK IMMEDIATE MEDICAL CARE IF:   You have severe or persistent:  Headache.  Ear pain.  Sinus pain.  Chest pain.  You have chronic lung disease and any of the following:  Wheezing.  Prolonged cough.  Coughing up blood.  A change in your usual mucus.  You have a stiff neck.  You have changes in your:  Vision.  Hearing.  Thinking.  Mood. MAKE SURE YOU:   Understand these instructions.  Will watch your condition.  Will get help right away if you are not doing well or get worse.   This information is not intended to replace advice given to you by your health care provider. Make sure you discuss any questions you have with your health care provider.   Document Released: 03/22/2001 Document Revised: 02/10/2015 Document Reviewed: 01/01/2014 Elsevier Interactive Patient Education 2016 [employer]  

## 2016-07-03 ENCOUNTER — Encounter (HOSPITAL_COMMUNITY): Payer: Self-pay | Admitting: Emergency Medicine

## 2016-07-03 ENCOUNTER — Emergency Department (HOSPITAL_COMMUNITY)
Admission: EM | Admit: 2016-07-03 | Discharge: 2016-07-03 | Disposition: A | Discharge status: Home / Self Care | Payer: Medicaid - Out of State | Attending: Emergency Medicine | Admitting: Emergency Medicine

## 2016-07-03 DIAGNOSIS — K0889 Other specified disorders of teeth and supporting structures: Secondary | ICD-10-CM

## 2016-07-03 DIAGNOSIS — J45909 Unspecified asthma, uncomplicated: Secondary | ICD-10-CM | POA: Insufficient documentation

## 2016-07-03 DIAGNOSIS — Z79899 Other long term (current) drug therapy: Secondary | ICD-10-CM | POA: Insufficient documentation

## 2016-07-03 DIAGNOSIS — Z7951 Long term (current) use of inhaled steroids: Secondary | ICD-10-CM | POA: Insufficient documentation

## 2016-07-03 LAB — RAPID STREP SCREEN (MED CTR MEBANE ONLY): STREPTOCOCCUS, GROUP A SCREEN (DIRECT): NEGATIVE

## 2016-07-03 MED ORDER — BENZOCAINE 10 % MT GEL: OROMUCOSAL | 0 refills | Status: DC

## 2016-07-03 MED ORDER — CLINDAMYCIN HCL 150 MG PO CAPS
450.0000 mg | ORAL_CAPSULE | Freq: Once | ORAL | Status: AC
  Administered 2016-07-03: 450 mg via ORAL
  Filled 2016-07-03 (×2): qty 1

## 2016-07-03 MED ORDER — CLINDAMYCIN HCL 150 MG PO CAPS: 450.0000 mg | ORAL_CAPSULE | Freq: Three times a day (TID) | ORAL | 0 refills | Status: DC

## 2016-07-03 MED ORDER — TRAMADOL HCL 50 MG PO TABS
50.0000 mg | ORAL_TABLET | Freq: Once | ORAL | Status: AC
  Administered 2016-07-03: 50 mg via ORAL
  Filled 2016-07-03: qty 1

## 2016-07-03 NOTE — ED Notes (Signed)
Pt ambulated out of department with friend.  No reaction to medication noted at discharge.

## 2016-07-03 NOTE — ED Provider Notes (Signed)
[facility]-EMERGENCY DEPT Provider Note   CSN: [id number] Arrival date & time: 07/03/16  1900  By signing my name below, I, Christy Sartorius, attest that this documentation has been prepared under the direction and in the presence of  Arvilla Meres, PA-C. Electronically Signed: Christy Sartorius, ED Scribe. 07/03/16. 8:24 PM.  History   Chief Complaint Chief Complaint  Patient presents with  . Dental Pain  . Sore Throat  . Otalgia   The history is provided by the patient and medical records. No language interpreter was used.     HPI Comments:  Elaine Jones is a 25 y.o. female who presents to the Emergency Department complaining of right lower dental pain onset 06/27/16.  She states her pain radiates to her left ear and gets worse over the course of the day.  She notes associated swelling at the site and adds that her pain is worse when talking or eating.  She also complains of a gradually worsening sore throat that began shortly after her dental pain; she believes they may be related.  She states it hurts to swallow on her left side. She is managing her oral secretions and able to eat and drink. She has tried alternating heat and ice and taking ibuprofen with some relief; her last dose was at 1600.  She also notes a slightly productive cough, She has also taken mucinex, with minimal relief.  She took a tramadol at noon that completely relieved the pain.  She denies trauma at the site, light sensitivity, fever, trouble swallowing, difficulty breathing, and vomitting.  Pt does not have a dentist or dental insurance.  Past Medical History:  Diagnosis Date  . Asthma   . Chest pain   . Heart murmur     Patient Active Problem List   Diagnosis Date Noted  . Heart murmur   . Asthma   . Chest pain     History reviewed. No pertinent surgical history.  OB History    No data available       Home Medications    Prior to Admission medications   Medication Sig Start Date End  Date Taking? Authorizing Provider  albuterol (PROVENTIL HFA;VENTOLIN HFA) 108 (90 Base) MCG/ACT inhaler Inhale 2 puffs into the lungs every 4 (four) hours as needed for wheezing or shortness of breath. 01/18/16   Shon Baton, MD  benzocaine (ORAJEL) 10 % mucosal gel Apply to affected tooth up to four times a day 07/03/16   Lona Kettle, PA-C  benzonatate (TESSALON) 100 MG capsule Take 1-2 capsules (100-200 mg total) by mouth 3 (three) times daily as needed for cough. Patient not taking: Reported on 10/29/2014 08/24/13   Peyton Najjar, MD  cetirizine (ZYRTEC) 10 MG chewable tablet Chew 1 tablet (10 mg total) by mouth daily. 01/18/16   Shon Baton, MD  clindamycin (CLEOCIN) 150 MG capsule Take 3 capsules (450 mg total) by mouth 3 (three) times daily. 07/03/16   Lona Kettle, PA-C  diphenhydrAMINE (BENADRYL) 25 MG tablet Take 1 tablet (25 mg total) by mouth every 6 (six) hours as needed for itching (Rash). 10/29/14   Antony Madura, PA-C  famotidine (PEPCID) 20 MG tablet Take 1 tablet (20 mg total) by mouth 2 (two) times daily. 10/29/14   Antony Madura, PA-C  fluticasone (FLONASE) 50 MCG/ACT nasal spray Place 2 sprays into both nostrils daily. 01/18/16   Shon Baton, MD  HYDROcodone-homatropine (HYCODAN) 5-1.5 MG/5ML syrup Take 5 mLs by mouth every 4 (four)  hours as needed for cough. Patient not taking: Reported on 10/29/2014 08/24/13   Peyton Najjaravid H Hopper, MD  ibuprofen (ADVIL,MOTRIN) 200 MG tablet Take 400 mg by mouth every 6 (six) hours as needed for moderate pain.    Historical Provider, MD    Family History Family History  Problem Relation Age of Onset  . Hypertension Mother     Social History Social History  Substance Use Topics  . Smoking status: Never Smoker  . Smokeless tobacco: Never Used  . Alcohol use Yes     Comment: rarely      Allergies   Amoxicillin   Review of Systems Review of Systems  Constitutional: Negative for fever.  HENT: Positive for dental  problem, ear pain and sore throat. Negative for congestion, rhinorrhea and trouble swallowing.   Eyes: Negative for photophobia.  Respiratory: Positive for cough. Negative for shortness of breath.   Cardiovascular: Negative for chest pain.  Gastrointestinal: Negative for vomiting.  Musculoskeletal: Negative for neck pain and neck stiffness.  Neurological: Positive for headaches.     Physical Exam Updated Vital Signs BP 117/78 (BP Location: Right Arm)   Pulse 88   Temp 98.6 F (37 C) (Oral)   Resp 16   Ht 5\' 4"  (1.626 m)   Wt 108 lb (49 kg)   LMP 06/24/2016   SpO2 100%   BMI 18.54 kg/m   Physical Exam  Constitutional: She is oriented to person, place, and time. She appears well-developed and well-nourished. No distress.  HENT:  Head: Normocephalic and atraumatic.  Right Ear: Tympanic membrane, external ear and ear canal normal.  Left Ear: Tympanic membrane, external ear and ear canal normal.  Nose: Right sinus exhibits no maxillary sinus tenderness and no frontal sinus tenderness. Left sinus exhibits no maxillary sinus tenderness and no frontal sinus tenderness.  Mouth/Throat: Uvula is midline, oropharynx is clear and moist and mucous membranes are normal. No trismus in the jaw. No oropharyngeal exudate. No tonsillar exudate.  No trismus. TTP palpation of # 32 with mild gingival swelling. No obvious abscess. No erythema. Uvula midline. No oropharyngeal swelling. No sublingual swelling or tenderness. Managing oral secretions.    Eyes: Conjunctivae and EOM are normal. Pupils are equal, round, and reactive to light. Right eye exhibits no discharge. Left eye exhibits no discharge. No scleral icterus.  No photophobia.  Neck: Normal range of motion and phonation normal. Neck supple. No neck rigidity. Normal range of motion present.  No nuchal rigidity. Shotty cervical lymphadenopathy.   Cardiovascular: Normal rate, regular rhythm and intact distal pulses.   Pulmonary/Chest: Effort  normal and breath sounds normal. No stridor. No respiratory distress. She has no wheezes. She has no rales.  Neurological: She is alert and oriented to person, place, and time.  Skin: Skin is warm and dry. She is not diaphoretic.  Psychiatric: She has a normal mood and affect. Her behavior is normal.  Nursing note and vitals reviewed.    ED Treatments / Results   DIAGNOSTIC STUDIES:  Oxygen Saturation is 100% on RA, NML  by my interpretation.    COORDINATION OF CARE:  8:24 PM Discussed treatment plan with pt at bedside and pt agreed to plan.  Labs (all labs ordered are listed, but only abnormal results are displayed) Labs Reviewed  RAPID STREP SCREEN (NOT AT [facility])  CULTURE, GROUP A STREP [facility])    EKG  EKG Interpretation None       Radiology No results found.  Procedures Procedures (including critical care  time)  Medications Ordered in ED Medications  traMADol (ULTRAM) tablet 50 mg (50 mg Oral Given 07/03/16 2009)  clindamycin (CLEOCIN) capsule 450 mg (450 mg Oral Given 07/03/16 2114)     Initial Impression / Assessment and Plan / ED Course  I have reviewed the triage vital signs and the nursing notes.  Pertinent labs & imaging results that were available during my care of the patient were reviewed by me and considered in my medical decision making (see chart for details).  Clinical Course    Patient presents to ED with complaint of dental pain, sore throat, right ear pain, and cough. Patient is afebrile and non-toxic appearing in NAD. VSS. TTP #32 with mild gingival swelling, no obvious abscess. Exam non concerning for ludwig's angina and pharyngeal abscess. No trismus. Uvula midline. No sublingual swelling or tenderness. No nuchal rigidity. Ears are clear. Respirations are unlabored. No hypoxia. No tachycardia. No wheezing or rales heard. No fever. Low suspicion for PNA. Rapid strep negative. Patient with dentalgia.  No abscess requiring immediate incision and  drainage.  Will treat with Clindamycin. Pt instructed to follow-up with dentist, contact information provided. Symptomatic management discussed. Rx orajel.  Discussed return precautions. Pt voiced understanding and is agreeable.   Final Clinical Impressions(s) / ED Diagnoses   Final diagnoses:  Pain, dental    New Prescriptions Discharge Medication List as of 07/03/2016  8:59 PM    START taking these medications   Details  benzocaine (ORAJEL) 10 % mucosal gel Apply to affected tooth up to four times a day, Print    clindamycin (CLEOCIN) 150 MG capsule Take 3 capsules (450 mg total) by mouth 3 (three) times daily., Starting Sun 07/03/2016, Print       I personally performed the services described in this documentation, which was scribed in my presence. The recorded information has been reviewed and is accurate.     Lona Kettle, [place] 07/04/16 1114    Raeford Razor, MD 07/04/16 [phone]

## 2016-07-03 NOTE — Discharge Instructions (Signed)
Read the information below.  Your rapid strep was negative. A culture is being sent, if positive you will be notified. You are being placed on antibiotics for your dental pain. Please take as directed. Discontinue and return to ED if you develop rash or difficulty breathing. Apply ice to affected area for 20 minute increments. I have prescribed orajel to apply to tooth for symptomatic relief. You can take tylenol 650mg  every 6hrs or motrin 400mg  every 6hrs for pain relief.  It is very important that you follow up with a dentist, I have provided his contact information. Please call tomorrow to schedule follow up.  I have also provided the contact information for [facility] for establishing a primary doctor.  Use the prescribed medication as directed.  Please discuss all new medications with your pharmacist.   You may return to the Emergency Department at any time for worsening condition or any new symptoms that concern you. Return if you develop fever, are unable to open your mouth, unable to swallow, have difficulty breathing, or unable to turn your neck.

## 2016-07-03 NOTE — ED Triage Notes (Signed)
Pt from home with complaints of dental pain on the bottom right jaw, sore throat, cough, and right ear pain x 1 week. Pt states she has not seen her dentist in "years" Pt states cough is "slightly productive"

## 2016-07-06 LAB — CULTURE, GROUP A STREP (THRC)

## 2018-07-12 ENCOUNTER — Other Ambulatory Visit: Payer: Self-pay

## 2018-07-12 ENCOUNTER — Encounter (HOSPITAL_BASED_OUTPATIENT_CLINIC_OR_DEPARTMENT_OTHER): Payer: Self-pay | Admitting: Emergency Medicine

## 2018-07-12 ENCOUNTER — Emergency Department (HOSPITAL_BASED_OUTPATIENT_CLINIC_OR_DEPARTMENT_OTHER): Payer: No Typology Code available for payment source

## 2018-07-12 ENCOUNTER — Emergency Department (HOSPITAL_BASED_OUTPATIENT_CLINIC_OR_DEPARTMENT_OTHER)
Admission: EM | Admit: 2018-07-12 | Discharge: 2018-07-12 | Disposition: A | Discharge status: Home / Self Care | Payer: No Typology Code available for payment source | Attending: Emergency Medicine | Admitting: Emergency Medicine

## 2018-07-12 DIAGNOSIS — J45909 Unspecified asthma, uncomplicated: Secondary | ICD-10-CM | POA: Insufficient documentation

## 2018-07-12 DIAGNOSIS — Z79899 Other long term (current) drug therapy: Secondary | ICD-10-CM | POA: Insufficient documentation

## 2018-07-12 DIAGNOSIS — S161XXA Strain of muscle, fascia and tendon at neck level, initial encounter: Secondary | ICD-10-CM | POA: Diagnosis not present

## 2018-07-12 DIAGNOSIS — S199XXA Unspecified injury of neck, initial encounter: Secondary | ICD-10-CM | POA: Diagnosis present

## 2018-07-12 DIAGNOSIS — Y92410 Unspecified street and highway as the place of occurrence of the external cause: Secondary | ICD-10-CM | POA: Insufficient documentation

## 2018-07-12 DIAGNOSIS — Y999 Unspecified external cause status: Secondary | ICD-10-CM | POA: Insufficient documentation

## 2018-07-12 DIAGNOSIS — Y939 Activity, unspecified: Secondary | ICD-10-CM | POA: Diagnosis not present

## 2018-07-12 MED ORDER — METHOCARBAMOL 500 MG PO TABS: 500.0000 mg | ORAL_TABLET | Freq: Two times a day (BID) | ORAL | 0 refills | Status: DC

## 2018-07-12 MED FILL — METHOCARBAMOL 500 MG TABLET: 500 | 10 days supply | Qty: 20 | Fill #0
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
## 2018-07-12 NOTE — ED Notes (Signed)
ED Provider at bedside discussing test results and dispo plan of care. 

## 2018-07-12 NOTE — ED Notes (Signed)
ED Provider at bedside. 
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
## 2018-07-12 NOTE — Discharge Instructions (Signed)

## 2018-08-08 ENCOUNTER — Encounter (HOSPITAL_BASED_OUTPATIENT_CLINIC_OR_DEPARTMENT_OTHER): Payer: Self-pay

## 2018-08-08 ENCOUNTER — Emergency Department (HOSPITAL_BASED_OUTPATIENT_CLINIC_OR_DEPARTMENT_OTHER)
Admission: EM | Admit: 2018-08-08 | Discharge: 2018-08-08 | Disposition: A | Discharge status: Home / Self Care | Payer: No Typology Code available for payment source | Attending: Emergency Medicine | Admitting: Emergency Medicine

## 2018-08-08 ENCOUNTER — Other Ambulatory Visit: Payer: Self-pay

## 2018-08-08 ENCOUNTER — Emergency Department (HOSPITAL_BASED_OUTPATIENT_CLINIC_OR_DEPARTMENT_OTHER): Payer: No Typology Code available for payment source

## 2018-08-08 DIAGNOSIS — J45909 Unspecified asthma, uncomplicated: Secondary | ICD-10-CM | POA: Insufficient documentation

## 2018-08-08 DIAGNOSIS — S6010XA Contusion of unspecified finger with damage to nail, initial encounter: Secondary | ICD-10-CM | POA: Diagnosis not present

## 2018-08-08 DIAGNOSIS — Y929 Unspecified place or not applicable: Secondary | ICD-10-CM | POA: Insufficient documentation

## 2018-08-08 DIAGNOSIS — S6991XA Unspecified injury of right wrist, hand and finger(s), initial encounter: Secondary | ICD-10-CM

## 2018-08-08 DIAGNOSIS — Y939 Activity, unspecified: Secondary | ICD-10-CM | POA: Insufficient documentation

## 2018-08-08 DIAGNOSIS — Y999 Unspecified external cause status: Secondary | ICD-10-CM | POA: Insufficient documentation

## 2018-08-08 DIAGNOSIS — W231XXA Caught, crushed, jammed, or pinched between stationary objects, initial encounter: Secondary | ICD-10-CM | POA: Diagnosis not present

## 2018-08-08 DIAGNOSIS — Z79899 Other long term (current) drug therapy: Secondary | ICD-10-CM | POA: Insufficient documentation

## 2018-08-08 NOTE — Discharge Instructions (Addendum)
You were evaluated today for right thumb pain.  Your x-ray of your thumb was negative for fracture dislocation.  You do have evidence of a small blood collection underneath your right thumb.  You did not want [place] to try to relieve this pressure while in the department.  Follow-up with your primary care provider for reevaluation in 2 days.

## 2018-08-08 NOTE — ED Provider Notes (Signed)
[facility] EMERGENCY DEPARTMENT Provider Note   CSN: [id number] Arrival date & time: 08/08/18  1625   History   Chief Complaint Chief Complaint  Patient presents with  . Hand Injury    HPI Elaine Jones is a 27 y.o. female with no significant medical history presents for evaluation of right thumb pain.  Patient states she was getting out of a car and closed her thumb in the door.  Has felt pain to the distal portion of her thumb on her right hand since incident.  Rates her pain a 7/10.  Describes her pain as a throbbing and pressure.  States she developed "blood underneath my nail."  She states she tried to stick a nail in the nailbed, however was not able to get deep enough to relieve the bladder pressure.  Denies fever, chills, nausea, vomiting, decreased range of motion in right upper extremity, numbness or tingling in her right upper extremity.  Denies tenderness to wrist or metacarpals.  History obtained from patient.  No interpretor was used.  HPI  Past Medical History:  Diagnosis Date  . Asthma   . Chest pain   . Heart murmur     Patient Active Problem List   Diagnosis Date Noted  . Heart murmur   . Asthma   . Chest pain     History reviewed. No pertinent surgical history.   OB History   None      Home Medications    Prior to Admission medications   Medication Sig Start Date End Date Taking? Authorizing Provider  albuterol (PROVENTIL HFA;VENTOLIN HFA) 108 (90 Base) MCG/ACT inhaler Inhale 2 puffs into the lungs every 4 (four) hours as needed for wheezing or shortness of breath. 01/18/16   Horton, Mayer Masker, MD  benzocaine (ORAJEL) 10 % mucosal gel Apply to affected tooth up to four times a day 07/03/16   Deborha Payment, PA-C  benzonatate (TESSALON) 100 MG capsule Take 1-2 capsules (100-200 mg total) by mouth 3 (three) times daily as needed for cough. Patient not taking: Reported on 10/29/2014 08/24/13   Peyton Najjar, MD  cetirizine  (ZYRTEC) 10 MG chewable tablet Chew 1 tablet (10 mg total) by mouth daily. 01/18/16   Horton, Mayer Masker, MD  clindamycin (CLEOCIN) 150 MG capsule Take 3 capsules (450 mg total) by mouth 3 (three) times daily. 07/03/16   Deborha Payment, PA-C  diphenhydrAMINE (BENADRYL) 25 MG tablet Take 1 tablet (25 mg total) by mouth every 6 (six) hours as needed for itching (Rash). 10/29/14   Antony Madura, PA-C  famotidine (PEPCID) 20 MG tablet Take 1 tablet (20 mg total) by mouth 2 (two) times daily. 10/29/14   Antony Madura, PA-C  fluticasone (FLONASE) 50 MCG/ACT nasal spray Place 2 sprays into both nostrils daily. 01/18/16   Horton, Mayer Masker, MD  HYDROcodone-homatropine (HYCODAN) 5-1.5 MG/5ML syrup Take 5 mLs by mouth every 4 (four) hours as needed for cough. Patient not taking: Reported on 10/29/2014 08/24/13   Peyton Najjar, MD  ibuprofen (ADVIL,MOTRIN) 200 MG tablet Take 400 mg by mouth every 6 (six) hours as needed for moderate pain.    [provider]  methocarbamol (ROBAXIN) 500 MG tablet Take 1 tablet (500 mg total) by mouth 2 (two) times daily. 07/12/18   Maxwell Caul, PA-C    Family History Family History  Problem Relation Age of Onset  . Hypertension Mother     Social History Social History   Tobacco Use  .  Smoking status: Never Smoker  . Smokeless tobacco: Never Used  Substance Use Topics  . Alcohol use: Yes    Comment: rarely   . Drug use: No     Allergies   Amoxicillin   Review of Systems Review of Systems  Constitutional: Negative.   Respiratory: Negative.   Cardiovascular: Negative.   Gastrointestinal: Negative.   Genitourinary: Negative.   Musculoskeletal:       Right thumb pain.  Neurological: Negative.   All other systems reviewed and are negative.    Physical Exam Updated Vital Signs BP 133/90 (BP Location: Left Arm)   Pulse 65   Temp 99.3 F (37.4 C) (Oral)   Resp 16   Ht 5\' 6"  (1.676 m)   Wt 47.6 kg   LMP 07/17/2018   SpO2 100%   BMI  16.95 kg/m   Physical Exam  Constitutional: She appears well-developed and well-nourished. No distress.  HENT:  Head: Atraumatic.  Eyes: Pupils are equal, round, and reactive to light.  Neck: Normal range of motion.  Cardiovascular: Normal rate and intact distal pulses.  Pulmonary/Chest: No respiratory distress.  Abdominal: She exhibits no distension.  Musculoskeletal: Normal range of motion.  No tenderness to palpation over wrists, scaphoid, metacarpals. No tenderness to IP over right thumb.  Tenderness over right thumbnail.  5/5 grip strength on right hand.  Able to flex and extend at all phalanges. Full ROM to bilateral upper extremity.  Neurological: She is alert.  Intact sensation to sharp and dull to right hand and phalanges.  Skin: Skin is warm and dry. She is not diaphoretic.  6mm subungual hematoma to right thumb.  No edema, erythema or warmth to right upper extremity.  Psychiatric: She has a normal mood and affect.  Nursing note and vitals reviewed.    ED Treatments / Results  Labs (all labs ordered are listed, but only abnormal results are displayed) Labs Reviewed - No data to display  EKG None  Radiology Dg Finger Thumb Right  Result Date: 08/08/2018 CLINICAL DATA:  Patient slammed car door on thumb today. Pain and swelling with discoloration near the nail bed. EXAM: RIGHT THUMB 2+V COMPARISON:  None. FINDINGS: There is no evidence of fracture or dislocation. There is no evidence of arthropathy or other focal bone abnormality. Soft tissues are unremarkable IMPRESSION: No fracture or malalignment of the right thumb. Electronically Signed   By: Tollie Eth M.D.   On: 08/08/2018 17:19    Procedures Procedures (including critical care time)  Medications Ordered in ED Medications - No data to display   Initial Impression / Assessment and Plan / ED Course  I have reviewed the triage vital signs and the nursing notes.  Pertinent labs & imaging results that were  available during my care of the patient were reviewed by me and considered in my medical decision making (see chart for details).  27 year old female who appears otherwise well presents for evaluation of right thumb injury.  Afebrile, nonseptic, non-ill-appearing.  Normal musculoskeletal exam.  Neurovascularly intact.  Right thumb nail with subungual hematoma.  Hematoma drained via trephination.  Patient with decreased pain after procedure.  No tenderness to scaphoid, metacarpals or wrist.  Low suspicion for scaphoid fracture at this time.  Plain film negative for fracture or dislocation.  Discussed return precautions with patient.  Patient voiced understanding agreeable for follow-up.  Patient stable for DC home at this time.    Final Clinical Impressions(s) / ED Diagnoses   Final diagnoses:  Injury  of right hand, initial encounter  Subungual hematoma of finger, initial encounter    ED Discharge Orders    None       Mahitha Hickling A, PA-C 08/08/18 1807    Melene Plan, DO 08/08/18 2229

## 2018-08-08 NOTE — ED Triage Notes (Signed)
Pt slammed right thumb in the car door this morning, has bruising and swelling to thumb

## 2020-09-19 ENCOUNTER — Other Ambulatory Visit: Payer: Self-pay

## 2020-09-19 ENCOUNTER — Encounter (HOSPITAL_BASED_OUTPATIENT_CLINIC_OR_DEPARTMENT_OTHER): Payer: Self-pay | Admitting: Emergency Medicine

## 2020-09-19 DIAGNOSIS — K0889 Other specified disorders of teeth and supporting structures: Secondary | ICD-10-CM | POA: Insufficient documentation

## 2020-09-19 DIAGNOSIS — Z7951 Long term (current) use of inhaled steroids: Secondary | ICD-10-CM | POA: Insufficient documentation

## 2020-09-19 DIAGNOSIS — J45909 Unspecified asthma, uncomplicated: Secondary | ICD-10-CM | POA: Insufficient documentation

## 2020-09-19 NOTE — ED Triage Notes (Signed)
Left upper dental pain, seen by dentist on Monday and pain has been worse since.

## 2020-09-20 ENCOUNTER — Emergency Department (HOSPITAL_BASED_OUTPATIENT_CLINIC_OR_DEPARTMENT_OTHER)
Admission: EM | Admit: 2020-09-20 | Discharge: 2020-09-20 | Disposition: A | Discharge status: Home / Self Care | Payer: Self-pay | Attending: Emergency Medicine | Admitting: Emergency Medicine

## 2020-09-20 DIAGNOSIS — K0889 Other specified disorders of teeth and supporting structures: Secondary | ICD-10-CM

## 2020-09-20 MED ORDER — CLINDAMYCIN HCL 300 MG PO CAPS: 300.0000 mg | ORAL_CAPSULE | Freq: Three times a day (TID) | ORAL | 0 refills | Status: AC

## 2020-09-20 MED ORDER — HYDROCODONE-ACETAMINOPHEN 5-325 MG PO TABS
1.0000 | ORAL_TABLET | Freq: Once | ORAL | Status: AC
  Administered 2020-09-20: 1 via ORAL
  Filled 2020-09-20: qty 1

## 2020-09-20 NOTE — ED Provider Notes (Signed)
[facility] EMERGENCY DEPARTMENT Provider Note   CSN: [id number] Arrival date & time: 09/19/20  2220     History Chief Complaint  Patient presents with  . Dental Problem    Elaine Jones is a 29 y.o. female.  The history is provided by the patient.  Dental Pain Location:  Lower Quality:  Aching Severity:  Mild Onset quality:  Gradual Timing:  Intermittent Progression:  Waxing and waning Chronicity:  New Context: dental caries   Relieved by:  Nothing Worsened by:  Nothing Associated symptoms: no congestion, no difficulty swallowing, no drooling, no facial pain, no facial swelling, no fever, no gum swelling, no headaches, no neck pain, no neck swelling, no oral bleeding, no oral lesions and no trismus        Past Medical History:  Diagnosis Date  . Asthma   . Chest pain   . Heart murmur     Patient Active Problem List   Diagnosis Date Noted  . Heart murmur   . Asthma   . Chest pain     History reviewed. No pertinent surgical history.   OB History   No obstetric history on file.     Family History  Problem Relation Age of Onset  . Hypertension Mother     Social History   Tobacco Use  . Smoking status: Never Smoker  . Smokeless tobacco: Never Used  Substance Use Topics  . Alcohol use: Yes    Comment: rarely   . Drug use: No    Home Medications Prior to Admission medications   Medication Sig Start Date End Date Taking? Authorizing Provider  albuterol (PROVENTIL HFA;VENTOLIN HFA) 108 (90 Base) MCG/ACT inhaler Inhale 2 puffs into the lungs every 4 (four) hours as needed for wheezing or shortness of breath. 01/18/16   Horton, Mayer Masker, MD  benzocaine (ORAJEL) 10 % mucosal gel Apply to affected tooth up to four times a day 07/03/16   Deborha Payment, PA-C  benzonatate (TESSALON) 100 MG capsule Take 1-2 capsules (100-200 mg total) by mouth 3 (three) times daily as needed for cough. Patient not taking: Reported on 10/29/2014  08/24/13   Peyton Najjar, MD  cetirizine (ZYRTEC) 10 MG chewable tablet Chew 1 tablet (10 mg total) by mouth daily. 01/18/16   Horton, Mayer Masker, MD  clindamycin (CLEOCIN) 300 MG capsule Take 1 capsule (300 mg total) by mouth 3 (three) times daily for 10 days. 09/20/20 09/30/20  Ameya Vowell, DO  diphenhydrAMINE (BENADRYL) 25 MG tablet Take 1 tablet (25 mg total) by mouth every 6 (six) hours as needed for itching (Rash). 10/29/14   Antony Madura, PA-C  famotidine (PEPCID) 20 MG tablet Take 1 tablet (20 mg total) by mouth 2 (two) times daily. 10/29/14   Antony Madura, PA-C  fluticasone (FLONASE) 50 MCG/ACT nasal spray Place 2 sprays into both nostrils daily. 01/18/16   Horton, Mayer Masker, MD  HYDROcodone-homatropine (HYCODAN) 5-1.5 MG/5ML syrup Take 5 mLs by mouth every 4 (four) hours as needed for cough. Patient not taking: Reported on 10/29/2014 08/24/13   Peyton Najjar, MD  ibuprofen (ADVIL,MOTRIN) 200 MG tablet Take 400 mg by mouth every 6 (six) hours as needed for moderate pain.    [provider]  methocarbamol (ROBAXIN) 500 MG tablet Take 1 tablet (500 mg total) by mouth 2 (two) times daily. 07/12/18   Maxwell Caul, PA-C    Allergies    Amoxicillin  Review of Systems   Review of Systems  Constitutional: Negative for fever.  HENT: Negative for congestion, drooling, facial swelling and mouth sores.   Musculoskeletal: Negative for neck pain.  Neurological: Negative for headaches.    Physical Exam Updated Vital Signs BP (!) 143/102   Pulse (!) 47   Temp 98.4 F (36.9 C)   Resp 16   Wt 48.2 kg   LMP 09/10/2020 (Approximate)   SpO2 100%   BMI 17.14 kg/m   Physical Exam Constitutional:      Appearance: She is not ill-appearing.  HENT:     Head: Normocephalic.     Mouth/Throat:     Comments: Lower molar with dental carry and filling that may be not completely covering tooth Neurological:     Mental Status: She is alert.     ED Results / Procedures /  Treatments   Labs (all labs ordered are listed, but only abnormal results are displayed) Labs Reviewed - No data to display  EKG None  Radiology No results found.  Procedures Procedures (including critical care time)  Medications Ordered in ED Medications  HYDROcodone-acetaminophen (NORCO/VICODIN) 5-325 MG per tablet 1 tablet (has no administration in time range)    ED Course  I have reviewed the triage vital signs and the nursing notes.  Pertinent labs & imaging results that were available during my care of the patient were reviewed by me and considered in my medical decision making (see chart for details).    MDM Rules/Calculators/A&P                          Elaine Jones is here for dental pain.  No concerning features.  Currently is due for root canal with dentistry.  Will prescribe clindamycin.  Given a dose of Norco.  Has follow-up with dentistry in place.  Left lower molar likely the cause of the pain as there is a chipped tooth with a filling over at the likely is not doing a great job.  Recommend Tylenol Motrin.  Discharged in good condition.  This chart was dictated using voice recognition software.  Despite best efforts to proofread,  errors can occur which can change the documentation meaning.   Final Clinical Impression(s) / ED Diagnoses Final diagnoses:  Pain, dental    Rx / DC Orders ED Discharge Orders         Ordered    clindamycin (CLEOCIN) 300 MG capsule  3 times daily        09/20/20 0123           Virgina Norfolk, DO 09/20/20 0125

## 2020-10-05 ENCOUNTER — Emergency Department (HOSPITAL_BASED_OUTPATIENT_CLINIC_OR_DEPARTMENT_OTHER): Payer: HRSA Program

## 2020-10-05 ENCOUNTER — Emergency Department (HOSPITAL_BASED_OUTPATIENT_CLINIC_OR_DEPARTMENT_OTHER)
Admission: EM | Admit: 2020-10-05 | Discharge: 2020-10-05 | Disposition: A | Discharge status: Home / Self Care | Payer: HRSA Program | Attending: Emergency Medicine | Admitting: Emergency Medicine

## 2020-10-05 ENCOUNTER — Encounter (HOSPITAL_BASED_OUTPATIENT_CLINIC_OR_DEPARTMENT_OTHER): Payer: Self-pay | Admitting: *Deleted

## 2020-10-05 ENCOUNTER — Other Ambulatory Visit: Payer: Self-pay

## 2020-10-05 DIAGNOSIS — J45909 Unspecified asthma, uncomplicated: Secondary | ICD-10-CM | POA: Diagnosis not present

## 2020-10-05 DIAGNOSIS — U071 COVID-19: Secondary | ICD-10-CM | POA: Diagnosis not present

## 2020-10-05 DIAGNOSIS — F17200 Nicotine dependence, unspecified, uncomplicated: Secondary | ICD-10-CM | POA: Insufficient documentation

## 2020-10-05 DIAGNOSIS — R059 Cough, unspecified: Secondary | ICD-10-CM | POA: Diagnosis present

## 2020-10-05 LAB — RESP PANEL BY RT-PCR (FLU A&B, COVID) ARPGX2
Influenza A by PCR: NEGATIVE
Influenza B by PCR: NEGATIVE
SARS Coronavirus 2 by RT PCR: POSITIVE — AB

## 2020-10-05 MED ORDER — SODIUM CHLORIDE 0.9 % IV BOLUS: 500.0000 mL | Freq: Once | INTRAVENOUS | Status: DC

## 2020-10-05 MED ORDER — ALBUTEROL SULFATE HFA 108 (90 BASE) MCG/ACT IN AERS
2.0000 | INHALATION_SPRAY | Freq: Once | RESPIRATORY_TRACT | Status: AC
Start: 2020-10-05 — End: 2020-10-05
  Administered 2020-10-05: 2 via RESPIRATORY_TRACT
  Filled 2020-10-05: qty 6.7

## 2020-10-05 NOTE — ED Provider Notes (Signed)
Emergency Department Provider Note   I have reviewed the triage vital signs and the nursing notes.   HISTORY  Chief Complaint No chief complaint on file.   HPI Elaine Jones is a 29 y.o. female with past medical history of asthma presents to the emergency department for evaluation of chest tightness starting acutely this morning.  She has developed some runny nose and was feeling chills this morning.  She states that she is feeling anxious initially thought she might be having a panic attack which prompted her ED visit.  She has not been vaccinated for Covid and has had close contacts with Covid recently.  She states that her smell and taste are different than they normally are and her appetite is poor.  She denies abdominal or back pain.  She is currently being treated for dental infection with clindamycin.  She states that her dental pain is gone but that she occasionally gets some pressure feeling in her left ear but none currently.  No radiation of symptoms or other modifying factors.   Past Medical History:  Diagnosis Date  . Asthma   . Chest pain   . Heart murmur     Patient Active Problem List   Diagnosis Date Noted  . Heart murmur   . Asthma   . Chest pain     History reviewed. No pertinent surgical history.  Allergies Amoxicillin  Family History  Problem Relation Age of Onset  . Hypertension Mother     Social History Social History   Tobacco Use  . Smoking status: Current Some Day Smoker  . Smokeless tobacco: Never Used  Substance Use Topics  . Alcohol use: Yes    Comment: rarely   . Drug use: No    Review of Systems  Constitutional: No fever/chills. Positive fatigue and poor appetite. Positive chills this AM.  Eyes: No visual changes. ENT: No sore throat. Left ear pain. Dental pain resolved.  Cardiovascular: Positive chest tightness.  Respiratory: Denies shortness of breath. Gastrointestinal: No abdominal pain.  No nausea, no  vomiting.  No diarrhea.  No constipation. Genitourinary: Negative for dysuria. Musculoskeletal: Negative for back pain. Skin: Negative for rash. Neurological: Negative for headaches, focal weakness or numbness.  10-point ROS otherwise negative.  ____________________________________________   PHYSICAL EXAM:  VITAL SIGNS: ED Triage Vitals  Enc Vitals Group     BP 10/05/20 0608 (!) 145/89     Pulse Rate 10/05/20 0608 (!) 104     Resp 10/05/20 0608 18     Temp 10/05/20 0608 97.9 F (36.6 C)     Temp Source 10/05/20 0608 Oral     SpO2 10/05/20 0608 100 %     Weight 10/05/20 0603 106 lb (48.1 kg)     Height 10/05/20 0603 5\' 5"  (1.651 m)   Constitutional: Alert and oriented. Well appearing and in no acute distress. Eyes: Conjunctivae are normal.  Head: Atraumatic. Nose: No congestion/rhinnorhea. Mouth/Throat: Mucous membranes are moist.   Neck: No stridor.  Cardiovascular: Tachycardia. Good peripheral circulation. Grossly normal heart sounds.   Respiratory: Normal respiratory effort.  No retractions. Lungs CTAB slightly diminished at the apices. No wheezing.  Gastrointestinal: Soft and nontender. No distention.  Musculoskeletal: No lower extremity tenderness nor edema. No gross deformities of extremities. Neurologic:  Normal speech and language. No gross focal neurologic deficits are appreciated.  Skin:  Skin is warm, dry and intact. No rash noted.   ____________________________________________   LABS (all labs ordered are listed, but only  abnormal results are displayed)  Labs Reviewed  RESP PANEL BY RT-PCR (FLU A&B, COVID) ARPGX2 - Abnormal; Notable for the following components:      Result Value   SARS Coronavirus 2 by RT PCR POSITIVE (*)    All other components within normal limits   ____________________________________________  EKG   EKG Interpretation  Date/Time:  Monday October 05 2020 06:22:13 EST Ventricular Rate:  102 PR Interval:    QRS Duration: 84 QT  Interval:  325 QTC Calculation: 424 R Axis:   88 Text Interpretation: Sinus tachycardia No STEMI Confirmed by Alona Bene [id number] on 10/05/2020 6:40:25 AM       ____________________________________________  RADIOLOGY  DG Chest Portable 1 View  Result Date: 10/05/2020 CLINICAL DATA:  29 year old female with left ear pressure, chest tightness, congested. EXAM: PORTABLE CHEST 1 VIEW COMPARISON:  Chest radiographs 05/29/2012. FINDINGS: Portable AP upright view at 0708 hours. Lung volumes and mediastinal contours remain normal. Visualized tracheal air column is within normal limits. Allowing for portable technique the lungs are clear. No pneumothorax or pleural effusion. No osseous abnormality identified. IMPRESSION: Negative portable chest. Electronically Signed   By: Odessa Fleming M.D.   On: 10/05/2020 07:40    ____________________________________________   PROCEDURES  Procedure(s) performed:   Procedures  None ____________________________________________   INITIAL IMPRESSION / ASSESSMENT AND PLAN / ED COURSE  Pertinent labs & imaging results that were available during my care of the patient were reviewed by me and considered in my medical decision making (see chart for details).   Patient presents to the emergency department for evaluation of chills this morning with some tightness in her chest and overall reports feeling anxious.  Her dental infection appears to be improving with pain resolved and is continuing clindamycin.  She has had Covid exposure in some of her symptoms suggest this as a possible diagnosis.  She was given several puffs of albuterol in the emergency department and during this vagal down into the 70s, felt lightheaded, by RT report had some brief irregular heartbeat/pauses and felt lightheaded.  Initially was going to get Covid test and reassess after EKG which was interpreted by me as above.  With lightheadedness and irregular heartbeat on monitor plan chest  x-ray.  COVID positive. Plan for discharge with notification of [facility] clinic with asthma and unvaccinated status. Dr. Stevie Kern to f/u on CXR and [facility] referral.   ____________________________________________  FINAL CLINICAL IMPRESSION(S) / ED DIAGNOSES  Final diagnoses:  COVID-19     MEDICATIONS GIVEN DURING THIS VISIT:  Medications  albuterol (VENTOLIN HFA) 108 (90 Base) MCG/ACT inhaler 2 puff (2 puffs Inhalation Given 10/05/20 0643)     Note:  This document was prepared using Dragon voice recognition software and may include unintentional dictation errors.  Alona Bene, MD, [facility] Emergency Medicine    Keshawn Fiorito, Arlyss Repress, MD 10/05/20 2101

## 2020-10-05 NOTE — ED Notes (Signed)
ED Provider at bedside. 

## 2020-10-05 NOTE — ED Notes (Signed)
RT at bedside.

## 2020-10-05 NOTE — Discharge Instructions (Addendum)
Follow-up with your primary doctor.  If you do not have 1, you can call the Covid clinic at [facility]  If you develop difficulty in breathing, chest pains or other new concerning symptom, return to ER for reassessment.  Please follow CDC isolation precautions.  Recommend minimum of 10 days from symptom onset for isolation.  Please notify all close contacts that you have had over the past few days of your positive diagnosis.

## 2020-10-05 NOTE — ED Notes (Signed)
EDP at bedside  

## 2020-10-05 NOTE — ED Triage Notes (Addendum)
Pt states that she is currently being treated for a left upper tooth ache with clindamycin. C/o left ear pressure and chest tightness. States she has a hx of asthma. Does not have an inhaler. Denies any sob. States her nose is "stuffed up" has not been vaccinated for covid. Denies fevers. C/o feeling "chilled" this morning and nervous.  SR/ST 100-105 on the monitor.

## 2022-03-03 IMAGING — DX DG CHEST 1V PORT
1 series · 1 of 1 positions shown · non-contrast
Comparison: Chest radiographs 05/29/2012.

CLINICAL DATA: 29-year-old female with left ear pressure, chest
tightness, congested.

EXAM:
PORTABLE CHEST 1 VIEW

[chest ap]
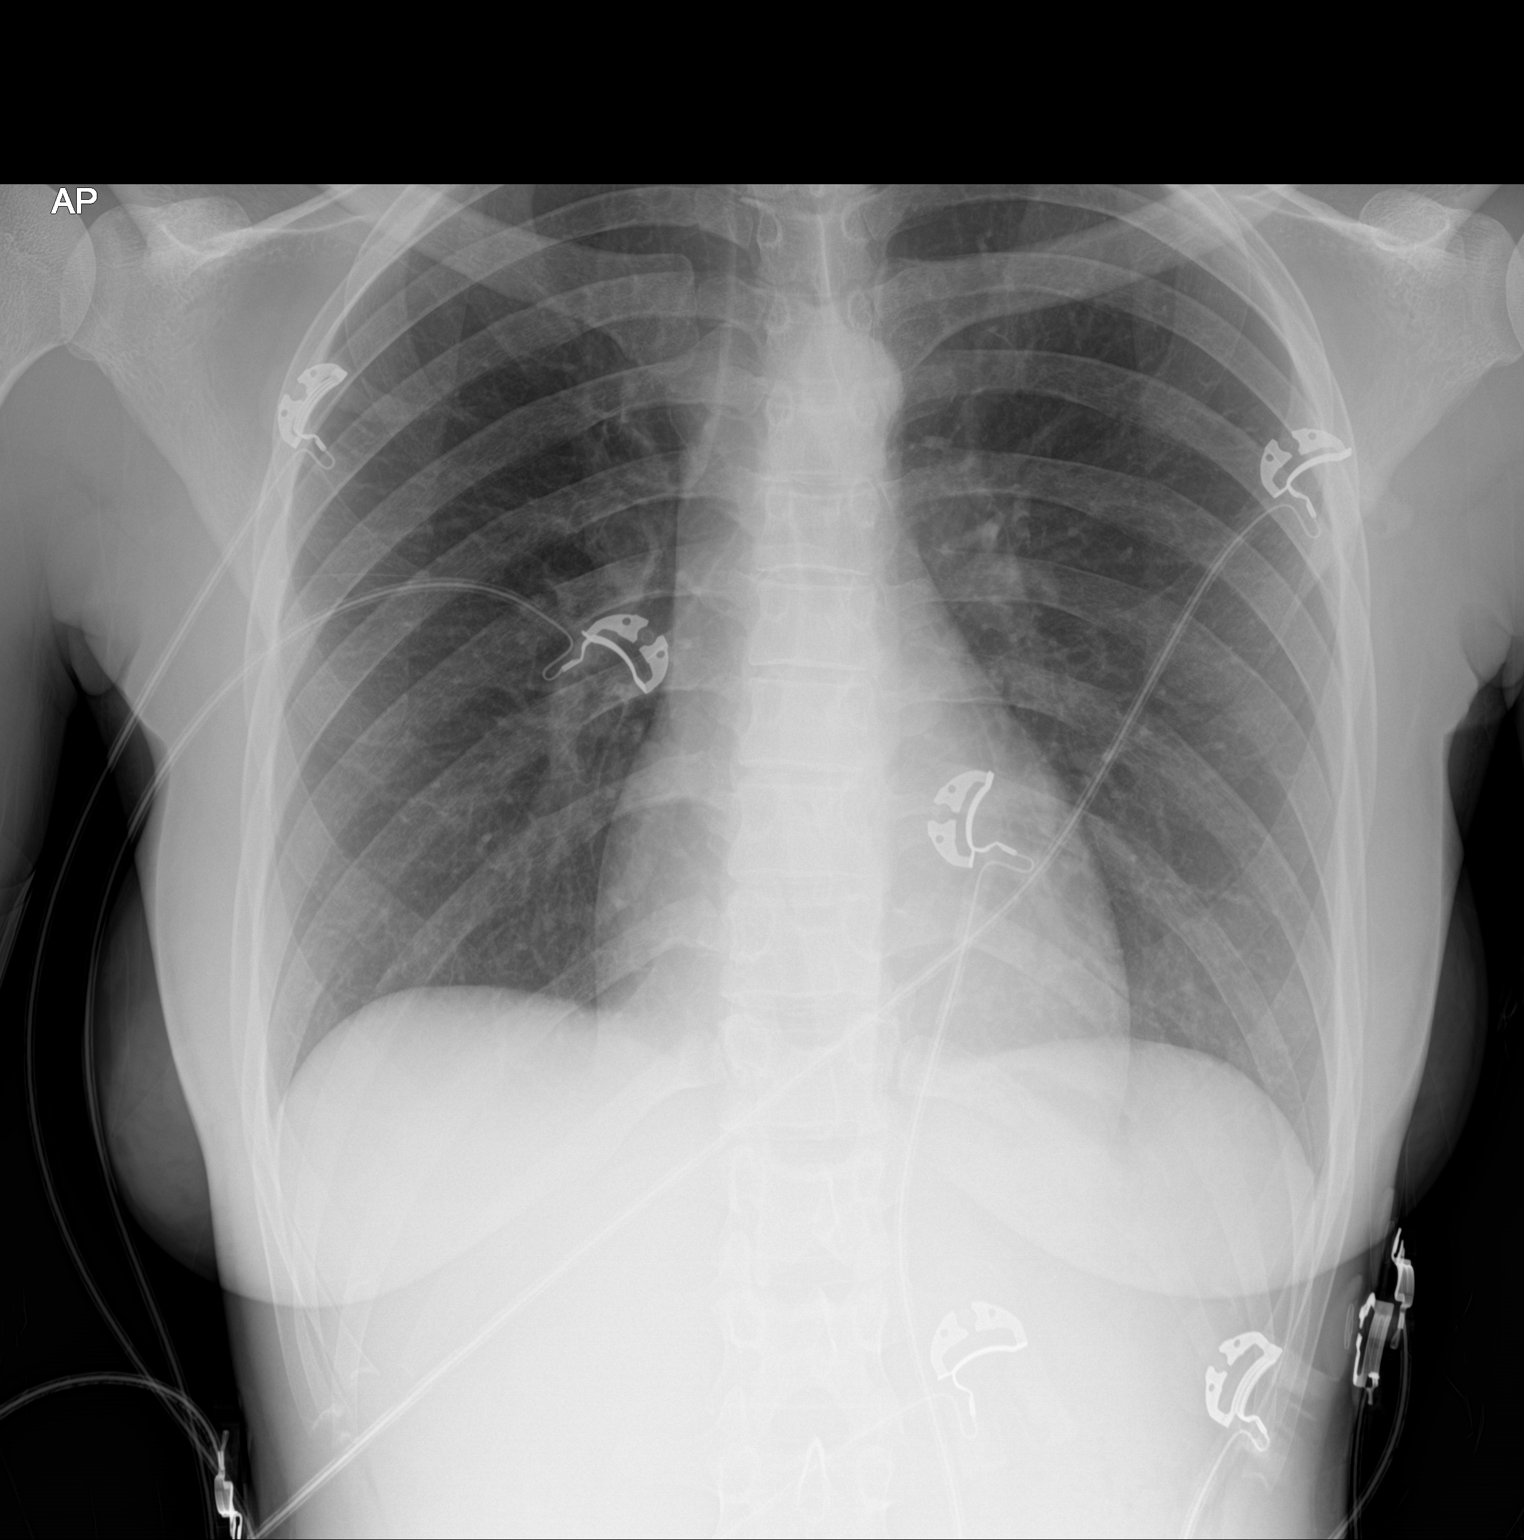

[1 of 1 positions shown; findings below may reference images not displayed]

FINDINGS: Portable AP upright view at 0504 hours. Lung volumes and mediastinal
contours remain normal. Visualized tracheal air column is within
normal limits. Allowing for portable technique the lungs are clear.
No pneumothorax or pleural effusion. No osseous abnormality
identified.
IMPRESSION: Negative portable chest.
